# Patient Record
Sex: Female | Born: 1995 | Hispanic: No | Marital: Single | Smoking: Never smoker
Health system: Southern US, Community
[De-identification: ages and names within clinical notes are randomized; demographics above are authoritative.]

---
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
## 2016-11-08 IMAGING — CR DG SINUSES COMPLETE 3+V
4 series · 4 of 4 positions shown · non-contrast
Comparison: None.

CLINICAL DATA: 18-year-old female with acute facial pain. Blunt
trauma to the head while playing soccer. Initial encounter.

EXAM:
PARANASAL SINUSES - COMPLETE 3 + VIEW

[PA]
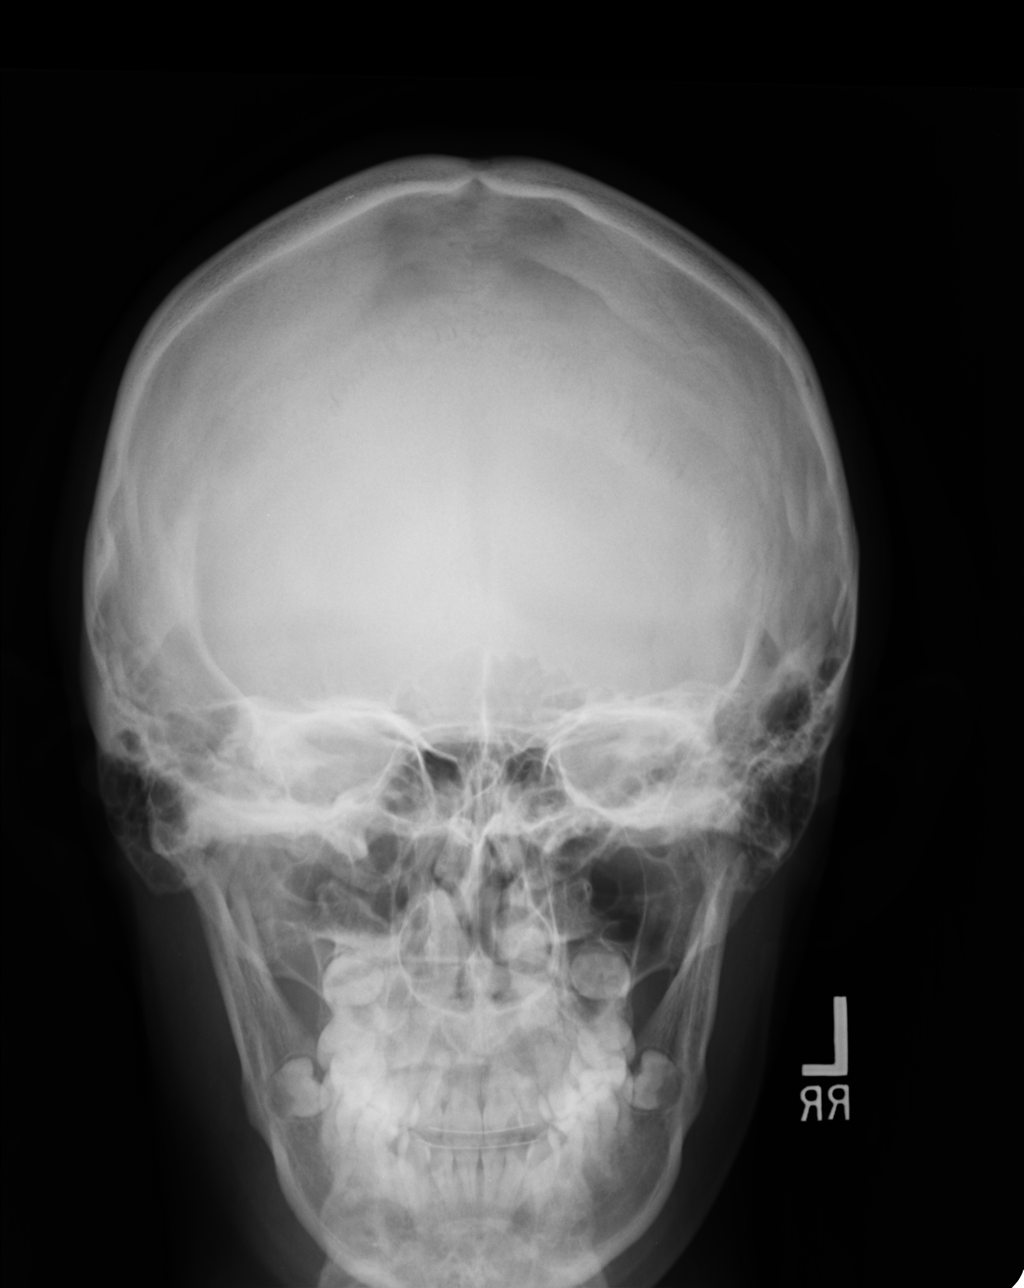

[waters]
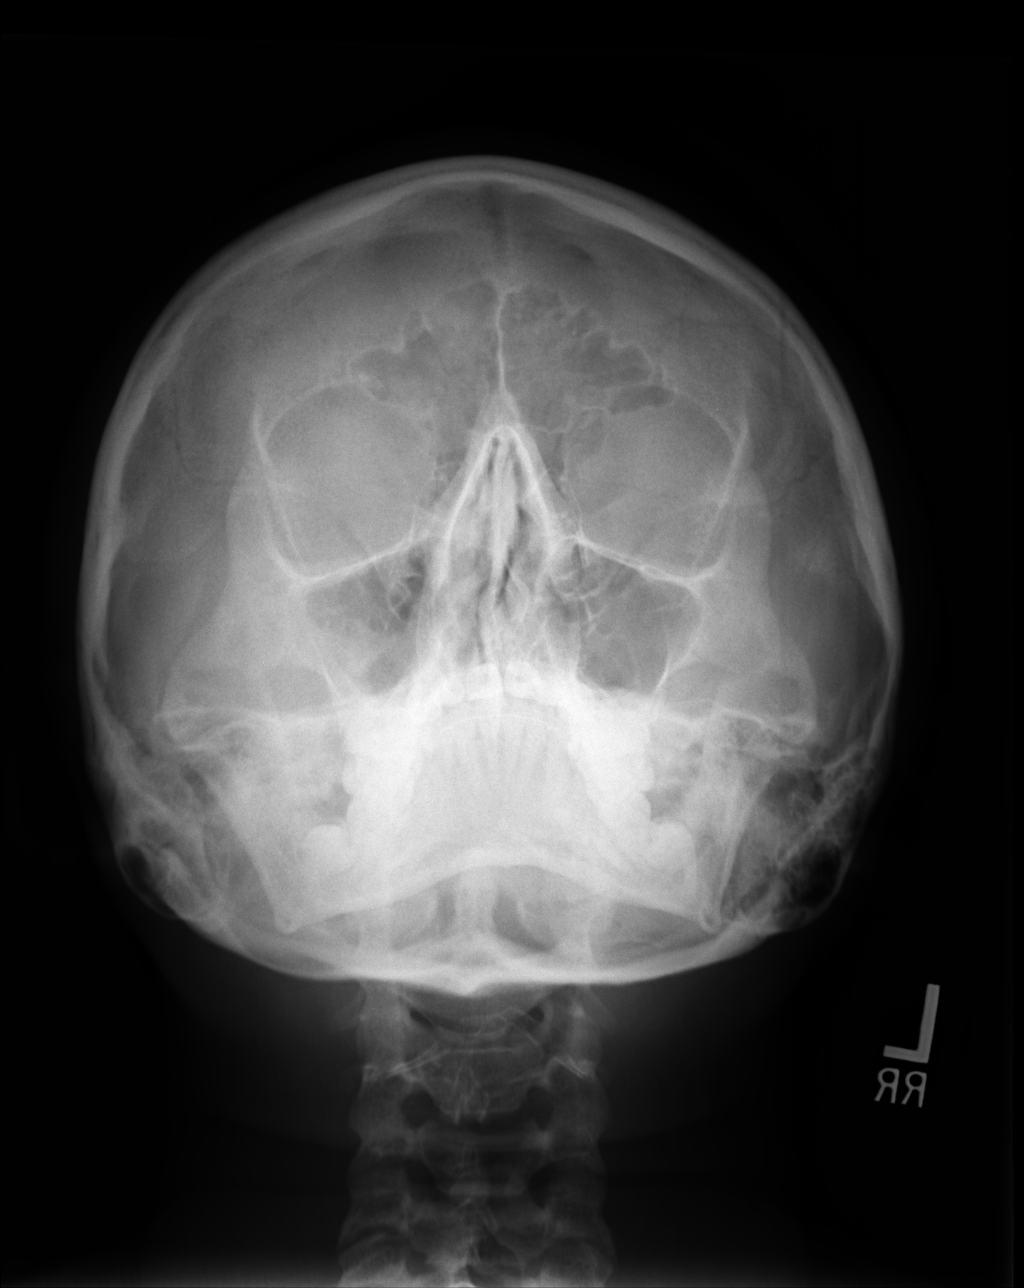

[lateral]
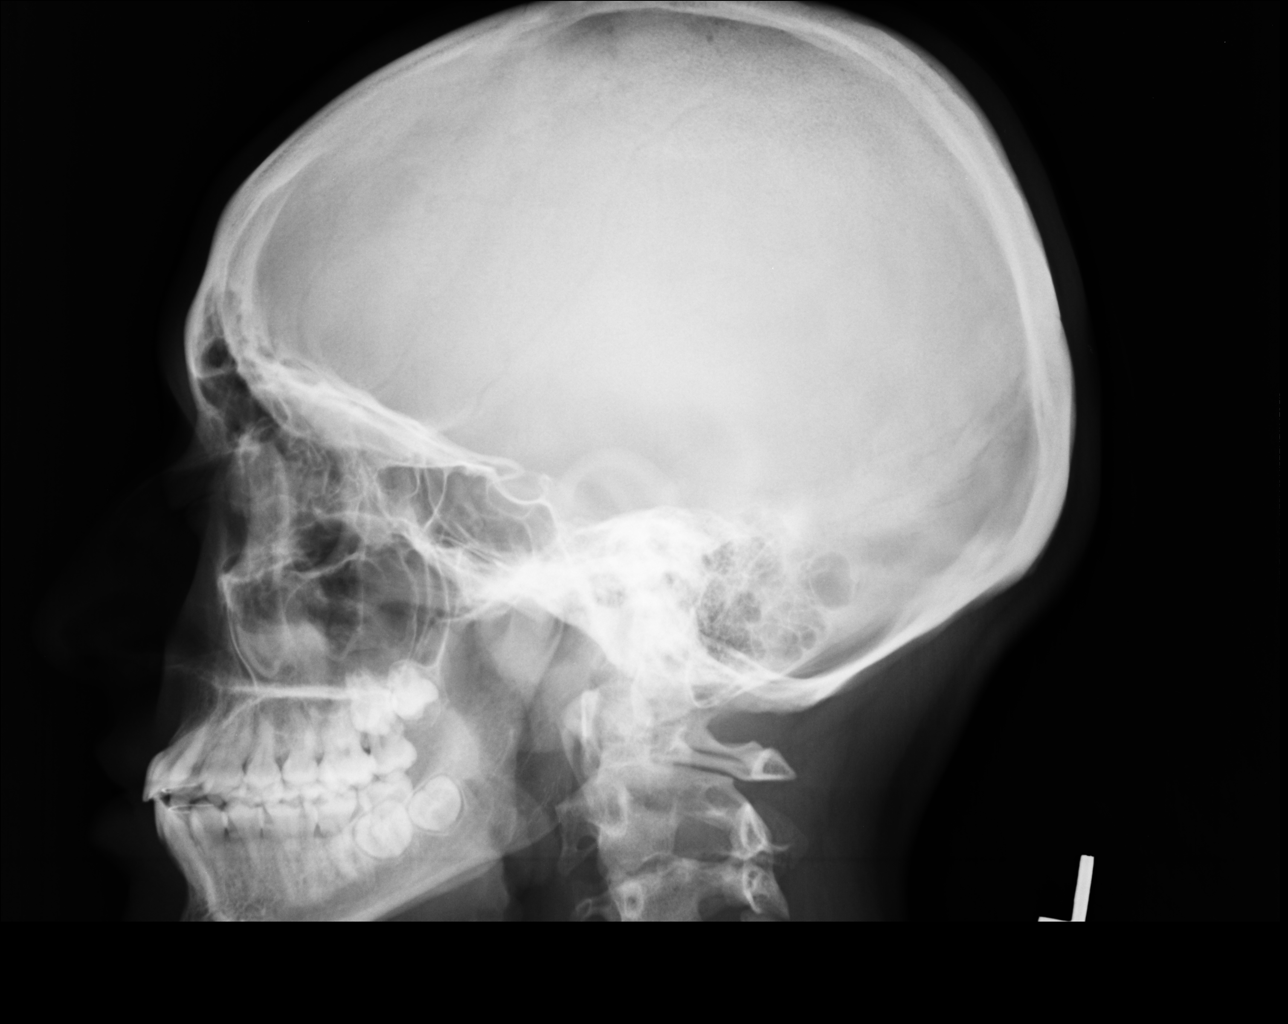

[smv]
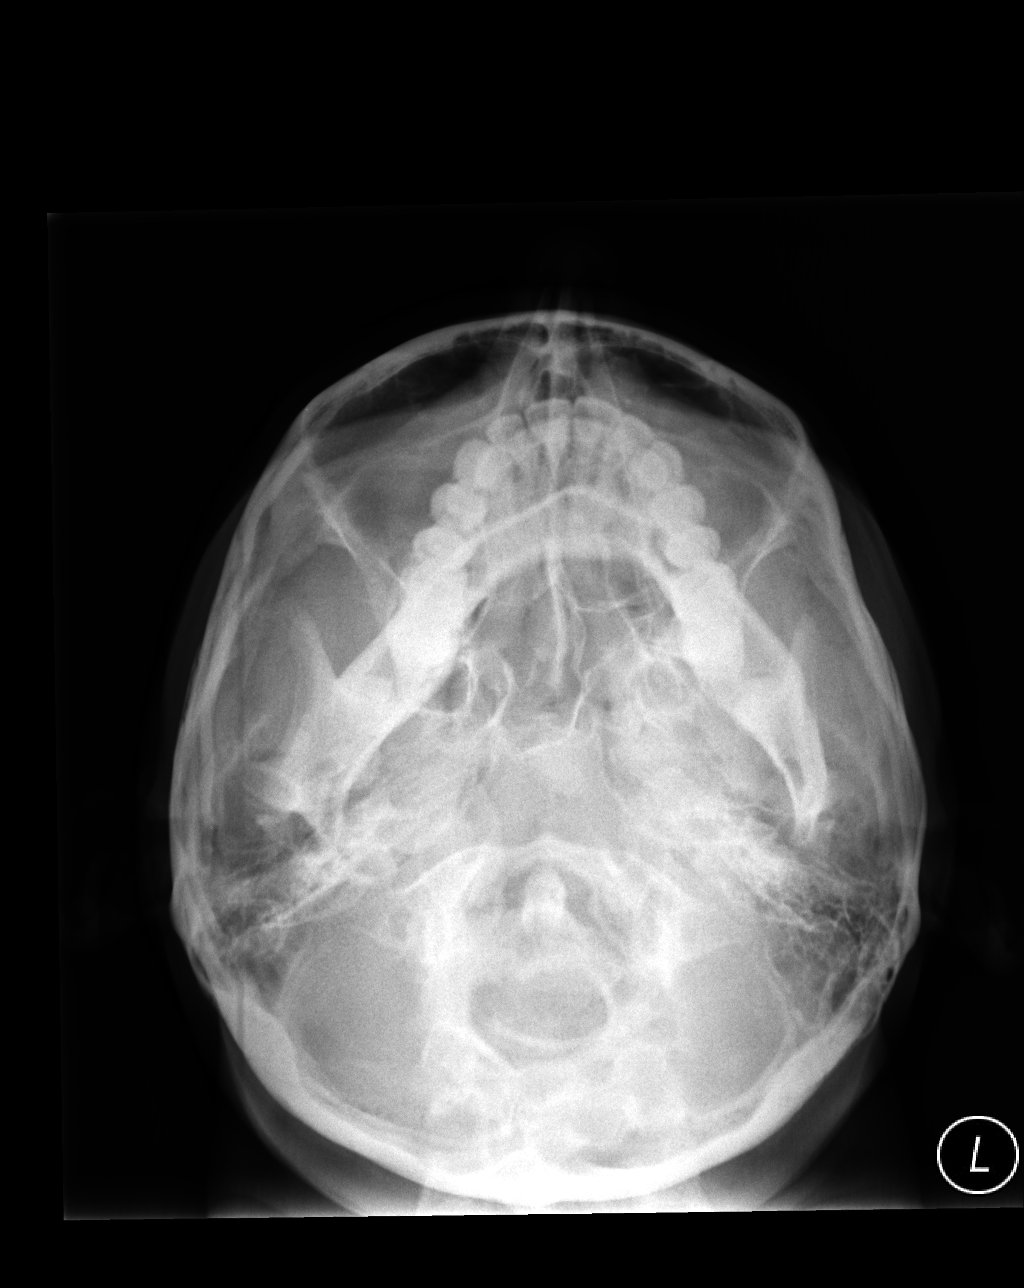

[4 of 4 positions shown; findings below may reference images not displayed]

FINDINGS: Bone mineralization is within normal limits. Frontal and ethmoid
sinuses appear within normal limits in clear. There is mildly
asymmetric opacity in the right maxillary sinus on the Waters view,
but this might be artifact. Sphenoid sinuses and bilateral mastoid
air cell pneumatization appears to be within normal limits. No
abnormal gas identified within the orbits. No skull fracture
identified.
IMPRESSION: No definite acute finding by plain radiographs.

If symptoms persist noncontrast head or face CT would be more
sensitive.
